# Patient Record
Sex: Male | Born: 1980 | Hispanic: Yes | Marital: Single | Smoking: Never smoker
Health system: Southern US, Community
[De-identification: ages and names within clinical notes are randomized; demographics above are authoritative.]

---

## 2005-08-02 ENCOUNTER — Emergency Department: Payer: Self-pay | Admitting: Unknown Physician Specialty

## 2005-08-09 ENCOUNTER — Emergency Department: Payer: Self-pay | Admitting: General Practice

## 2005-08-11 ENCOUNTER — Emergency Department: Payer: Self-pay | Admitting: Emergency Medicine

## 2008-11-13 ENCOUNTER — Emergency Department: Payer: Self-pay | Admitting: Emergency Medicine

## 2009-09-15 ENCOUNTER — Emergency Department: Payer: Self-pay | Admitting: Emergency Medicine

## 2011-11-07 IMAGING — CR DG CHEST 2V
1 series · 2 of 2 positions shown · non-contrast
Comparison: none

REASON FOR EXAM: pain
COMMENTS:   LMP: male

PROCEDURE:     DXR - DXR CHEST PA (OR AP) AND LATERAL  - September 15, 2009  [DATE]
RESULT:     No acute abnormality. Plate-like atelectasis noted in the right
mid lung field. The cardiovascular structures are unremarkable.

[Series 1: view not recorded · 0.17mm/px · 2 of 2 slices shown]
[im 1/2]
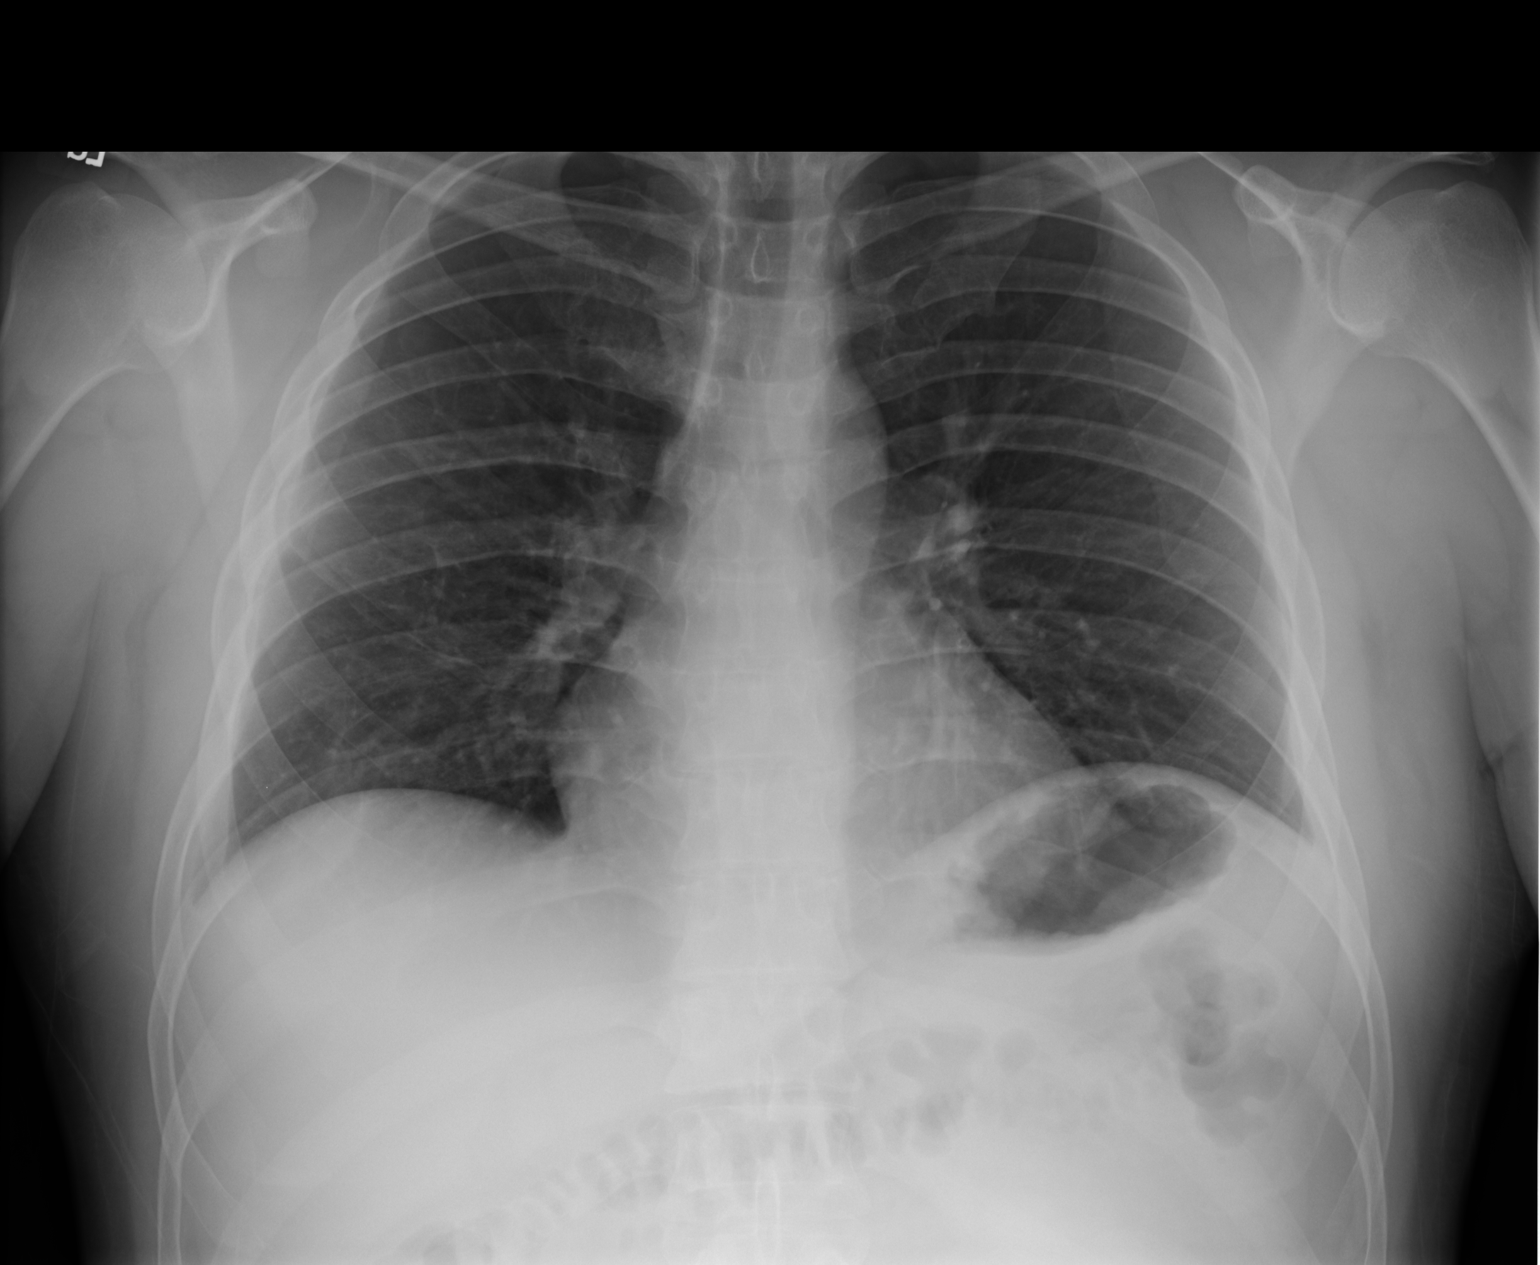
[im 2/2]
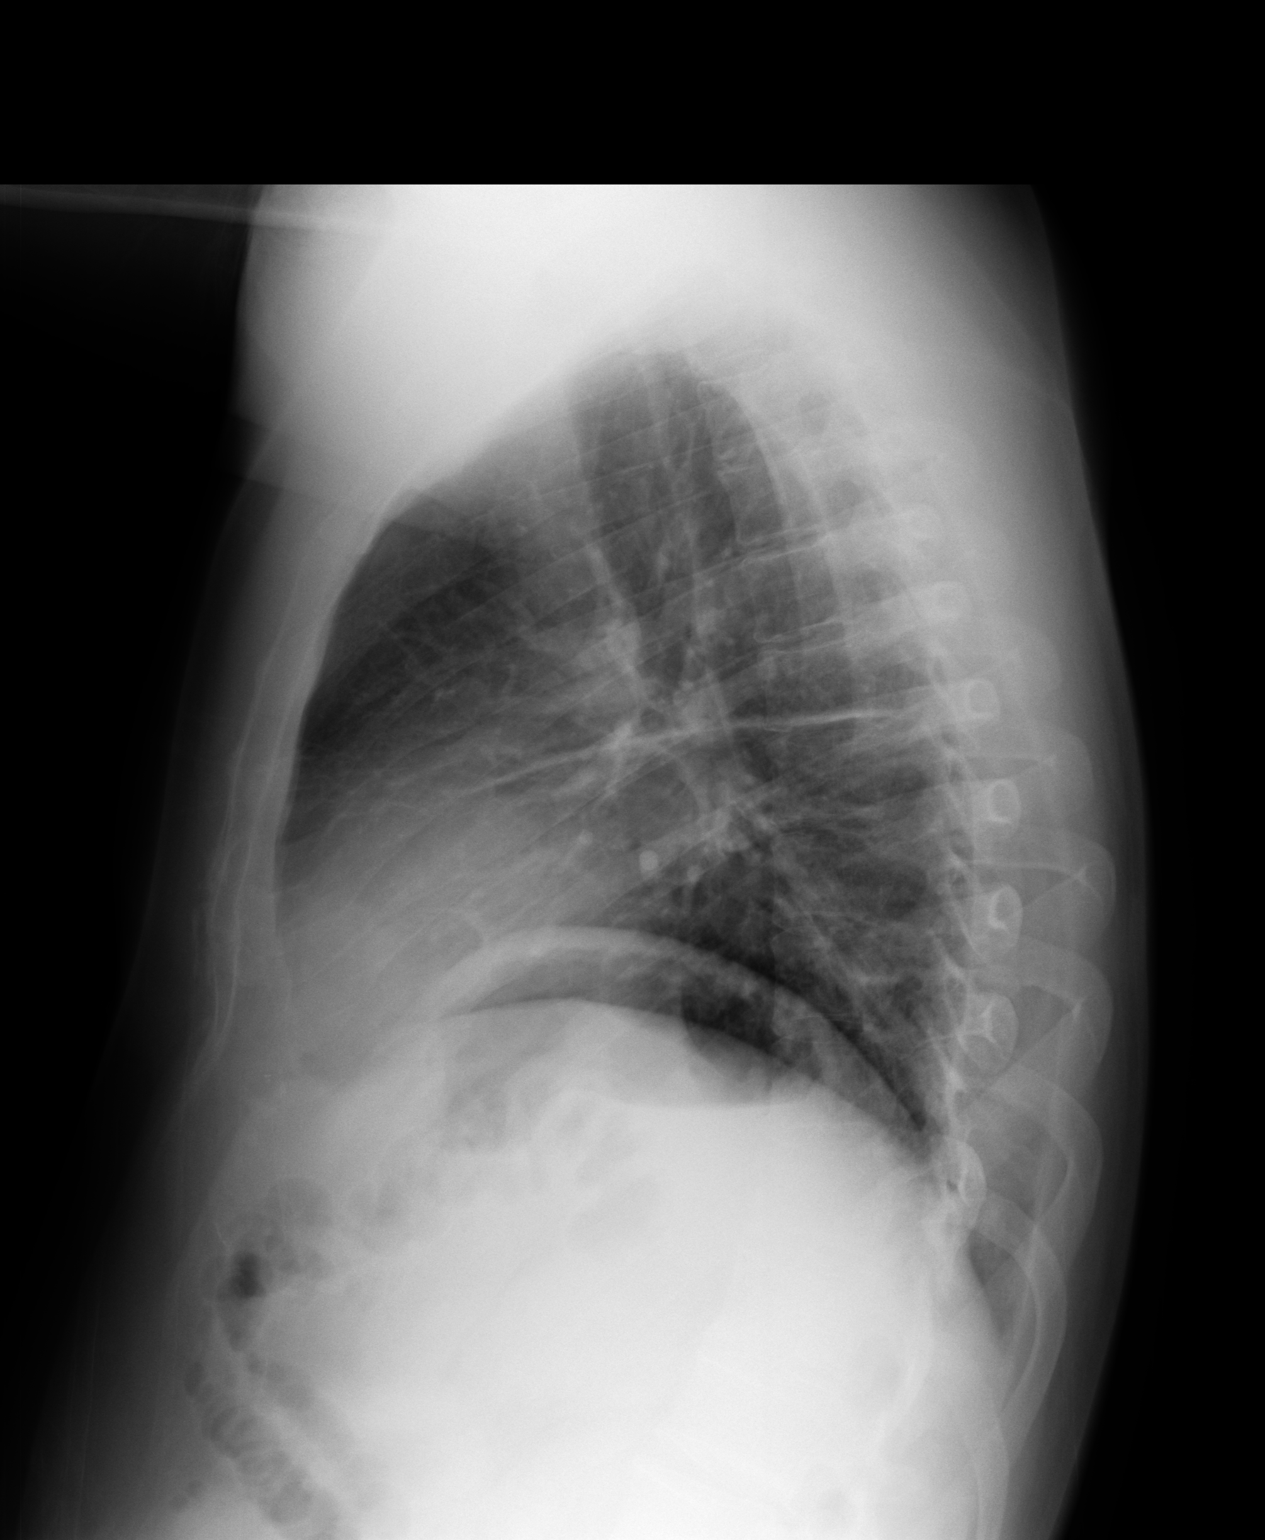

[2 of 2 positions shown; findings below may reference images not displayed]

IMPRESSION: No acute abnormality. Mild plate-like atelectasis noted in
the right mid lung field. Follow-up chest x-ray can be obtained to
demonstrate stability or clearing.

## 2019-01-27 ENCOUNTER — Telehealth: Payer: Self-pay

## 2019-01-27 ENCOUNTER — Telehealth: Payer: Self-pay | Admitting: *Deleted

## 2019-01-27 ENCOUNTER — Other Ambulatory Visit: Payer: Self-pay

## 2019-01-27 DIAGNOSIS — Z20822 Contact with and (suspected) exposure to covid-19: Secondary | ICD-10-CM

## 2019-01-27 NOTE — Telephone Encounter (Signed)
Left VM to return call to [phone] via interpreter.  [place] [phone]  Pt needs COVID-19 testing

## 2019-01-27 NOTE — Telephone Encounter (Signed)
Called patient Research scientist (life sciences) # [phone].  Pt had been scheduled previously but stated he thinks he went to the wrong place. Address given to him and he is scheduled for tomorrow at the [facility] in [place].  He is scheduled for 8:45.  Advised to stay in car, wear mask and keep windows rolled up until ready to be tested. Pt voiced understanding.

## 2019-01-27 NOTE — Telephone Encounter (Signed)
Incoming call from [employer] Department.  Referring Patient for covid-19 testing.  Patient is scheduled for today at 2:00pm. Patient voices understanding via Interpreter .  Number [phone]

## 2019-01-28 ENCOUNTER — Other Ambulatory Visit: Payer: Self-pay

## 2019-01-28 DIAGNOSIS — Z20822 Contact with and (suspected) exposure to covid-19: Secondary | ICD-10-CM

## 2019-01-28 NOTE — Addendum Note (Signed)
Addended by: Denman George on: 01/28/2019 08:07 AM   Modules accepted: Orders

## 2019-01-30 LAB — NOVEL CORONAVIRUS, NAA: SARS-CoV-2, NAA: NOT DETECTED

## 2023-02-27 ENCOUNTER — Emergency Department: Payer: Self-pay

## 2023-02-27 ENCOUNTER — Other Ambulatory Visit: Payer: Self-pay

## 2023-02-27 ENCOUNTER — Emergency Department
Admission: EM | Admit: 2023-02-27 | Discharge: 2023-02-27 | Disposition: A | Discharge status: Home / Self Care | Payer: Self-pay | Attending: Emergency Medicine | Admitting: Emergency Medicine

## 2023-02-27 DIAGNOSIS — R079 Chest pain, unspecified: Secondary | ICD-10-CM

## 2023-02-27 DIAGNOSIS — R0789 Other chest pain: Secondary | ICD-10-CM | POA: Insufficient documentation

## 2023-02-27 DIAGNOSIS — F17200 Nicotine dependence, unspecified, uncomplicated: Secondary | ICD-10-CM | POA: Insufficient documentation

## 2023-02-27 LAB — TROPONIN I (HIGH SENSITIVITY): Troponin I (High Sensitivity): 5 ng/L (ref <18)

## 2023-02-27 LAB — BASIC METABOLIC PANEL
Anion gap: 8 (ref 5–15)
BUN: 8 mg/dL (ref 6–20)
CO2: 23 mmol/L (ref 22–32)
Calcium: 8.2 mg/dL — ABNORMAL LOW (ref 8.9–10.3)
Chloride: 107 mmol/L (ref 98–111)
Creatinine, Ser: 0.6 mg/dL — ABNORMAL LOW (ref 0.61–1.24)
GFR, Estimated: >60 mL/min (ref >60)
Glucose, Bld: 137 mg/dL — ABNORMAL HIGH (ref 70–99)
Potassium: 3.6 mmol/L (ref 3.5–5.1)
Sodium: 138 mmol/L (ref 135–145)

## 2023-02-27 LAB — CBC
HCT: 39.2 % (ref 39.0–52.0)
Hemoglobin: 11.1 g/dL — ABNORMAL LOW (ref 13.0–17.0)
MCH: 20.4 pg — ABNORMAL LOW (ref 26.0–34.0)
MCHC: 28.3 g/dL — ABNORMAL LOW (ref 30.0–36.0)
MCV: 71.9 fL — ABNORMAL LOW (ref 80.0–100.0)
Platelets: 470 10*3/uL — ABNORMAL HIGH (ref 150–400)
RBC: 5.45 MIL/uL (ref 4.22–5.81)
RDW: 16.4 % — ABNORMAL HIGH (ref 11.5–15.5)
WBC: 5.9 10*3/uL (ref 4.0–10.5)
nRBC: 0 % (ref 0.0–0.2)

## 2023-02-27 MED ORDER — NAPROXEN 500 MG PO TABS: 500.0000 mg | ORAL_TABLET | Freq: Two times a day (BID) | ORAL | 0 refills | Status: AC

## 2023-02-27 NOTE — ED Provider Notes (Signed)
[facility] Provider Note    Event Date/Time   First MD Initiated Contact with Patient 02/27/23 1052     (approximate)   History   Chest Pain   HPI  Ronald Barry is a 42 y.o. male   presents to the ED with complaint of left-sided chest pain with radiation into his left arm for approximately 1 month.  Patient states that pain is intermittent and comes and goes lasting 1 to 2 days at a time.  No history of injury, cardiac history, shortness of breath, difficulty breathing, diaphoresis, nausea or vomiting.  Patient has not taken any over-the-counter medications for this.  He does smoke approximately 6 cigarettes/day.      Physical Exam   Triage Vital Signs: ED Triage Vitals  Encounter Vitals Group     BP 02/27/23 0940 137/84     Systolic BP Percentile --      Diastolic BP Percentile --      Pulse Rate 02/27/23 0940 87     Resp 02/27/23 0940 18     Temp 02/27/23 0940 98 F (36.7 C)     Temp src --      SpO2 02/27/23 0940 97 %     Weight 02/27/23 0941 200 lb (90.7 kg)     Height --      Head Circumference --      Peak Flow --      Pain Score 02/27/23 0941 6     Pain Loc --      Pain Education --      Exclude from Growth Chart --     Most recent vital signs: Vitals:   02/27/23 0940 02/27/23 1156  BP: 137/84 130/80  Pulse: 87 80  Resp: 18 18  Temp: 98 F (36.7 C)   SpO2: 97% 98%     General: Awake, no distress.  Talking in complete sentences without any difficulty. CV:  Good peripheral perfusion.  Heart regular rate and rhythm. Resp:  Normal effort.  Lungs are clear bilaterally. Abd:  No distention.  Soft, nontender, bowel sounds present x 4 quadrants. Other:  Able move upper extremities without any difficulty or reproduction of pain.   ED Results / Procedures / Treatments   Labs (all labs ordered are listed, but only abnormal results are displayed) Labs Reviewed  BASIC METABOLIC PANEL - Abnormal; Notable for the following  components:      Result Value   Glucose, Bld 137 (*)    Creatinine, Ser 0.60 (*)    Calcium 8.2 (*)    All other components within normal limits  CBC - Abnormal; Notable for the following components:   Hemoglobin 11.1 (*)    MCV 71.9 (*)    MCH 20.4 (*)    MCHC 28.3 (*)    RDW 16.4 (*)    Platelets 470 (*)    All other components within normal limits  TROPONIN I (HIGH SENSITIVITY)  TROPONIN I (HIGH SENSITIVITY)     EKG  Vent. rate 98 BPM PR interval 144 ms QRS duration 74 ms QT/QTcB 364/464 ms P-R-T axes 75 67 21 Normal sinus rhythm Normal ECG When compared with ECG of 15-Sep-2009 14:02,   RADIOLOGY  X-ray images were reviewed by myself independent of the radiologist and was negative for acute changes.  Official radiology report is negative for acute cardiopulmonary changes.   PROCEDURES:  Critical Care performed:   Procedures   MEDICATIONS ORDERED IN ED: Medications - No data  to display   IMPRESSION / MDM / ASSESSMENT AND PLAN / ED COURSE  I reviewed the triage vital signs and the nursing notes.   Differential diagnosis includes, but is not limited to, chest pain, muscle skeletal pain, muscle skeletal strain, left shoulder pain, cardiac event.  42 year old male presents to the ED with complaint of left sided anterior chest wall pain with radiation to his left shoulder for approximately 1 month.  Patient denies any shortness of breath, diaphoresis, indigestion, shoulder injury or injury to his chest.  No over-the-counter medications has been taking.  Troponin was reassuring at 5, BMP nonfasting showed glucose of 137, creatinine 0.6.  Hemoglobin slightly decreased at 11.1.  Chest x-ray was unremarkable and EKG findings as above.  Video interpreter was used to answer all patient questions.  He was reassured with troponin, EKG and chest x-ray unremarkable.  Patient is willing to try an anti-inflammatory to see if this helps with his nonspecific chest wall pain.  A  prescription for naproxen was sent to the pharmacy for him to begin taking.  He also requested a note for work.  Patient is to follow-up with a PCP or return to the emergency department if any severe worsening of his symptoms or urgent concerns.      Patient's presentation is most consistent with acute illness / injury with system symptoms.  FINAL CLINICAL IMPRESSION(S) / ED DIAGNOSES   Final diagnoses:  Nonspecific chest pain     Rx / DC Orders   ED Discharge Orders          Ordered    naproxen (NAPROSYN) 500 MG tablet  2 times daily with meals        02/27/23 1145             Note:  This document was prepared using Dragon voice recognition software and may include unintentional dictation errors.   Tommi Rumps, PA-C 02/27/23 1459    Sharman Cheek, MD 02/28/23 [phone]

## 2023-02-27 NOTE — ED Triage Notes (Signed)
Pt to ED for left sided chest pain and left hand numbness for one month. Reports worsening sx.  Denies n/v/shob.

## 2023-02-27 NOTE — Discharge Instructions (Addendum)
Begin taking naproxen 500 mg twice daily with food. Obtain a primary care provider.  Call [facility] health services or one of the clinics listed on your discharge papers.  Please go to the following website to schedule new (and existing) patient appointments:   [email or web address]   The following is a list of primary care offices in the area who are accepting new patients at this time.  Please reach out to one of them directly and let them know you would like to schedule an appointment to follow up on an Emergency Department visit, and/or to establish a new primary care provider (PCP).  There are likely other primary care clinics in the are who are accepting new patients, but this is an excellent place to start:  [facility] Lead physician: Dr Shirlee Latch [address] [place], [place] [address] [phone]  [facility] Lead Physician: Dr Alba Cory [address], [place], [place] [address] [phone]  [facility]  Lead Physician: Dr Olevia Perches [address] [place], [place] [address] [phone]  [facility] Lead Physician: Dr Sofie Hartigan [address] [place], [place] [address] [phone]  [facility] at [facility] Lead Physician: Dr Bari Edward [address] [place], [place], [place] [address] [phone]
